# Patient Record
Sex: Male | Born: 2000 | Race: White | Hispanic: No | Marital: Single
Health system: Southern US, Community
[De-identification: ages and names within clinical notes are randomized; demographics above are authoritative.]

---
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
## 2022-01-01 ENCOUNTER — Ambulatory Visit: Payer: Medicaid Other | Attending: Orthopaedic Surgery | Admitting: Physical Therapy

## 2022-01-01 ENCOUNTER — Encounter: Payer: Self-pay | Admitting: Physical Therapy

## 2022-01-01 ENCOUNTER — Other Ambulatory Visit: Payer: Self-pay

## 2022-01-01 DIAGNOSIS — M25562 Pain in left knee: Secondary | ICD-10-CM | POA: Diagnosis present

## 2022-01-01 DIAGNOSIS — R600 Localized edema: Secondary | ICD-10-CM | POA: Diagnosis present

## 2022-01-01 DIAGNOSIS — M6281 Muscle weakness (generalized): Secondary | ICD-10-CM | POA: Insufficient documentation

## 2022-01-01 DIAGNOSIS — R2689 Other abnormalities of gait and mobility: Secondary | ICD-10-CM | POA: Insufficient documentation
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
## 2022-01-03 ENCOUNTER — Other Ambulatory Visit: Payer: Self-pay

## 2022-01-03 ENCOUNTER — Ambulatory Visit: Payer: Medicaid Other | Admitting: Physical Therapy

## 2022-01-03 ENCOUNTER — Encounter: Payer: Self-pay | Admitting: Physical Therapy

## 2022-01-03 DIAGNOSIS — M25562 Pain in left knee: Secondary | ICD-10-CM | POA: Diagnosis not present

## 2022-01-03 DIAGNOSIS — M6281 Muscle weakness (generalized): Secondary | ICD-10-CM
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
## 2022-01-10 ENCOUNTER — Other Ambulatory Visit: Payer: Self-pay

## 2022-01-10 ENCOUNTER — Encounter: Payer: Self-pay | Admitting: Physical Therapy

## 2022-01-10 ENCOUNTER — Ambulatory Visit: Payer: Medicaid Other | Admitting: Physical Therapy

## 2022-01-10 DIAGNOSIS — R2689 Other abnormalities of gait and mobility: Secondary | ICD-10-CM

## 2022-01-10 DIAGNOSIS — M25562 Pain in left knee: Secondary | ICD-10-CM | POA: Diagnosis not present

## 2022-01-10 DIAGNOSIS — M6281 Muscle weakness (generalized): Secondary | ICD-10-CM
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
## 2022-01-15 ENCOUNTER — Ambulatory Visit: Payer: Medicaid Other | Admitting: Physical Therapy

## 2022-01-17 ENCOUNTER — Ambulatory Visit: Payer: Medicaid Other | Admitting: Physical Therapy

## 2022-01-22 ENCOUNTER — Other Ambulatory Visit: Payer: Self-pay

## 2022-01-22 ENCOUNTER — Encounter: Payer: Self-pay | Admitting: Physical Therapy

## 2022-01-22 ENCOUNTER — Ambulatory Visit: Payer: Medicaid Other | Admitting: Physical Therapy

## 2022-01-22 DIAGNOSIS — R2689 Other abnormalities of gait and mobility: Secondary | ICD-10-CM

## 2022-01-22 DIAGNOSIS — M25562 Pain in left knee: Secondary | ICD-10-CM | POA: Diagnosis not present

## 2022-01-22 DIAGNOSIS — M6281 Muscle weakness (generalized): Secondary | ICD-10-CM
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
## 2022-01-24 ENCOUNTER — Other Ambulatory Visit: Payer: Self-pay

## 2022-01-24 ENCOUNTER — Encounter: Payer: Self-pay | Admitting: Physical Therapy

## 2022-01-24 ENCOUNTER — Ambulatory Visit: Payer: Medicaid Other | Admitting: Physical Therapy

## 2022-01-24 DIAGNOSIS — M6281 Muscle weakness (generalized): Secondary | ICD-10-CM

## 2022-01-24 DIAGNOSIS — M25562 Pain in left knee: Secondary | ICD-10-CM

## 2022-01-24 DIAGNOSIS — R2689 Other abnormalities of gait and mobility: Secondary | ICD-10-CM
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
## 2022-01-31 ENCOUNTER — Other Ambulatory Visit: Payer: Self-pay

## 2022-01-31 ENCOUNTER — Ambulatory Visit: Payer: Medicaid Other | Attending: Orthopaedic Surgery

## 2022-01-31 DIAGNOSIS — R600 Localized edema: Secondary | ICD-10-CM | POA: Insufficient documentation

## 2022-01-31 DIAGNOSIS — M25562 Pain in left knee: Secondary | ICD-10-CM | POA: Insufficient documentation

## 2022-01-31 DIAGNOSIS — R2689 Other abnormalities of gait and mobility: Secondary | ICD-10-CM | POA: Diagnosis present

## 2022-01-31 DIAGNOSIS — M6281 Muscle weakness (generalized): Secondary | ICD-10-CM | POA: Insufficient documentation
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
## 2022-02-07 ENCOUNTER — Ambulatory Visit: Payer: Medicaid Other | Admitting: Physical Therapy

## 2022-02-07 ENCOUNTER — Other Ambulatory Visit: Payer: Self-pay

## 2022-02-07 ENCOUNTER — Encounter: Payer: Self-pay | Admitting: Physical Therapy

## 2022-02-07 DIAGNOSIS — R2689 Other abnormalities of gait and mobility: Secondary | ICD-10-CM

## 2022-02-07 DIAGNOSIS — M6281 Muscle weakness (generalized): Secondary | ICD-10-CM

## 2022-02-07 DIAGNOSIS — M25562 Pain in left knee: Secondary | ICD-10-CM
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
## 2022-02-12 ENCOUNTER — Ambulatory Visit: Payer: Medicaid Other | Admitting: Physical Therapy

## 2022-02-14 ENCOUNTER — Ambulatory Visit: Payer: Medicaid Other | Admitting: Physical Therapy
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
## 2022-02-19 ENCOUNTER — Other Ambulatory Visit: Payer: Self-pay

## 2022-02-19 ENCOUNTER — Ambulatory Visit: Payer: Medicaid Other

## 2022-02-19 DIAGNOSIS — M25562 Pain in left knee: Secondary | ICD-10-CM | POA: Diagnosis not present

## 2022-02-19 DIAGNOSIS — R600 Localized edema: Secondary | ICD-10-CM

## 2022-02-19 DIAGNOSIS — R2689 Other abnormalities of gait and mobility: Secondary | ICD-10-CM

## 2022-02-19 DIAGNOSIS — M6281 Muscle weakness (generalized): Secondary | ICD-10-CM

## 2022-02-21 ENCOUNTER — Ambulatory Visit: Payer: Medicaid Other | Admitting: Physical Therapy

## 2022-02-21 ENCOUNTER — Other Ambulatory Visit: Payer: Self-pay

## 2022-02-21 ENCOUNTER — Encounter: Payer: Self-pay | Admitting: Physical Therapy

## 2022-02-21 DIAGNOSIS — M6281 Muscle weakness (generalized): Secondary | ICD-10-CM

## 2022-02-21 DIAGNOSIS — R2689 Other abnormalities of gait and mobility: Secondary | ICD-10-CM

## 2022-02-21 DIAGNOSIS — M25562 Pain in left knee: Secondary | ICD-10-CM | POA: Diagnosis not present
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
## 2022-02-26 ENCOUNTER — Other Ambulatory Visit: Payer: Self-pay

## 2022-02-26 ENCOUNTER — Ambulatory Visit: Payer: Medicaid Other | Admitting: Physical Therapy

## 2022-02-26 ENCOUNTER — Encounter: Payer: Self-pay | Admitting: Physical Therapy

## 2022-02-26 DIAGNOSIS — M25562 Pain in left knee: Secondary | ICD-10-CM | POA: Diagnosis not present

## 2022-02-26 DIAGNOSIS — R2689 Other abnormalities of gait and mobility: Secondary | ICD-10-CM

## 2022-02-26 DIAGNOSIS — M6281 Muscle weakness (generalized): Secondary | ICD-10-CM
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
## 2022-02-28 ENCOUNTER — Ambulatory Visit: Payer: Medicaid Other | Admitting: Physical Therapy

## 2022-03-05 ENCOUNTER — Encounter: Payer: Self-pay | Admitting: Physical Therapy

## 2022-03-05 ENCOUNTER — Ambulatory Visit: Payer: Medicaid Other | Attending: Orthopaedic Surgery | Admitting: Physical Therapy

## 2022-03-05 ENCOUNTER — Other Ambulatory Visit: Payer: Self-pay

## 2022-03-05 DIAGNOSIS — M6281 Muscle weakness (generalized): Secondary | ICD-10-CM | POA: Insufficient documentation

## 2022-03-05 DIAGNOSIS — R2689 Other abnormalities of gait and mobility: Secondary | ICD-10-CM | POA: Insufficient documentation

## 2022-03-05 DIAGNOSIS — M25562 Pain in left knee: Secondary | ICD-10-CM | POA: Diagnosis not present

## 2022-03-05 DIAGNOSIS — R600 Localized edema: Secondary | ICD-10-CM | POA: Diagnosis present
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
## 2022-03-07 ENCOUNTER — Encounter: Payer: Self-pay | Admitting: Physical Therapy

## 2022-03-07 ENCOUNTER — Other Ambulatory Visit: Payer: Self-pay

## 2022-03-07 ENCOUNTER — Ambulatory Visit: Payer: Medicaid Other | Admitting: Physical Therapy

## 2022-03-07 DIAGNOSIS — R2689 Other abnormalities of gait and mobility: Secondary | ICD-10-CM

## 2022-03-07 DIAGNOSIS — M6281 Muscle weakness (generalized): Secondary | ICD-10-CM

## 2022-03-07 DIAGNOSIS — M25562 Pain in left knee: Secondary | ICD-10-CM | POA: Diagnosis not present
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
## 2022-03-12 ENCOUNTER — Ambulatory Visit: Payer: Medicaid Other | Admitting: Physical Therapy

## 2022-03-12 ENCOUNTER — Other Ambulatory Visit: Payer: Self-pay

## 2022-03-12 ENCOUNTER — Encounter: Payer: Self-pay | Admitting: Physical Therapy

## 2022-03-12 DIAGNOSIS — R2689 Other abnormalities of gait and mobility: Secondary | ICD-10-CM

## 2022-03-12 DIAGNOSIS — M25562 Pain in left knee: Secondary | ICD-10-CM

## 2022-03-12 DIAGNOSIS — M6281 Muscle weakness (generalized): Secondary | ICD-10-CM
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
## 2022-03-21 ENCOUNTER — Encounter: Payer: Self-pay | Admitting: Physical Therapy

## 2022-03-21 ENCOUNTER — Other Ambulatory Visit: Payer: Self-pay

## 2022-03-21 ENCOUNTER — Ambulatory Visit: Payer: Medicaid Other | Admitting: Physical Therapy

## 2022-03-21 DIAGNOSIS — R600 Localized edema: Secondary | ICD-10-CM

## 2022-03-21 DIAGNOSIS — M25562 Pain in left knee: Secondary | ICD-10-CM

## 2022-03-21 DIAGNOSIS — M6281 Muscle weakness (generalized): Secondary | ICD-10-CM

## 2022-03-21 DIAGNOSIS — R2689 Other abnormalities of gait and mobility: Secondary | ICD-10-CM
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
## 2022-03-26 ENCOUNTER — Encounter: Payer: Self-pay | Admitting: Physical Therapy

## 2022-03-28 ENCOUNTER — Ambulatory Visit: Payer: Medicaid Other | Admitting: Physical Therapy

## 2022-03-28 ENCOUNTER — Encounter: Payer: Self-pay | Admitting: Physical Therapy

## 2022-03-28 DIAGNOSIS — M6281 Muscle weakness (generalized): Secondary | ICD-10-CM

## 2022-03-28 DIAGNOSIS — R2689 Other abnormalities of gait and mobility: Secondary | ICD-10-CM

## 2022-03-28 DIAGNOSIS — M25562 Pain in left knee: Secondary | ICD-10-CM | POA: Diagnosis not present
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
## 2022-04-02 ENCOUNTER — Encounter: Payer: Self-pay | Admitting: Physical Therapy

## 2022-04-02 ENCOUNTER — Ambulatory Visit: Payer: Medicaid Other | Attending: Orthopaedic Surgery | Admitting: Physical Therapy

## 2022-04-02 DIAGNOSIS — M25562 Pain in left knee: Secondary | ICD-10-CM | POA: Diagnosis present

## 2022-04-02 DIAGNOSIS — M6281 Muscle weakness (generalized): Secondary | ICD-10-CM | POA: Diagnosis present

## 2022-04-02 DIAGNOSIS — R2689 Other abnormalities of gait and mobility: Secondary | ICD-10-CM | POA: Diagnosis present
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
## 2022-04-04 ENCOUNTER — Encounter: Payer: Self-pay | Admitting: Physical Therapy

## 2022-04-09 ENCOUNTER — Ambulatory Visit: Payer: Medicaid Other | Admitting: Physical Therapy

## 2022-04-11 ENCOUNTER — Encounter: Payer: Self-pay | Admitting: Physical Therapy

## 2022-04-16 ENCOUNTER — Ambulatory Visit: Payer: Medicaid Other | Admitting: Physical Therapy

## 2022-04-16 ENCOUNTER — Encounter: Payer: Self-pay | Admitting: Physical Therapy

## 2022-04-16 DIAGNOSIS — M25562 Pain in left knee: Secondary | ICD-10-CM

## 2022-04-16 DIAGNOSIS — R2689 Other abnormalities of gait and mobility: Secondary | ICD-10-CM

## 2022-04-16 DIAGNOSIS — M6281 Muscle weakness (generalized): Secondary | ICD-10-CM
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
## 2022-04-18 ENCOUNTER — Encounter: Payer: Self-pay | Admitting: Physical Therapy

## 2022-04-23 ENCOUNTER — Ambulatory Visit: Payer: Medicaid Other | Admitting: Physical Therapy

## 2022-04-23 ENCOUNTER — Encounter: Payer: Self-pay | Admitting: Physical Therapy

## 2022-04-23 DIAGNOSIS — M25562 Pain in left knee: Secondary | ICD-10-CM | POA: Diagnosis not present

## 2022-04-23 DIAGNOSIS — R2689 Other abnormalities of gait and mobility: Secondary | ICD-10-CM

## 2022-04-23 DIAGNOSIS — M6281 Muscle weakness (generalized): Secondary | ICD-10-CM
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
## 2022-05-07 ENCOUNTER — Encounter: Payer: Self-pay | Admitting: Physical Therapy

## 2022-05-07 ENCOUNTER — Ambulatory Visit: Payer: Medicaid Other | Attending: Orthopaedic Surgery | Admitting: Physical Therapy

## 2022-05-07 DIAGNOSIS — R2689 Other abnormalities of gait and mobility: Secondary | ICD-10-CM | POA: Diagnosis present

## 2022-05-07 DIAGNOSIS — M6281 Muscle weakness (generalized): Secondary | ICD-10-CM | POA: Diagnosis present

## 2022-05-07 DIAGNOSIS — M25562 Pain in left knee: Secondary | ICD-10-CM | POA: Diagnosis present

## 2022-05-07 DIAGNOSIS — R600 Localized edema: Secondary | ICD-10-CM | POA: Insufficient documentation
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
## 2022-05-14 ENCOUNTER — Ambulatory Visit: Payer: Medicaid Other | Admitting: Physical Therapy

## 2022-05-21 ENCOUNTER — Ambulatory Visit: Payer: Medicaid Other | Admitting: Physical Therapy

## 2022-05-28 ENCOUNTER — Ambulatory Visit: Payer: Medicaid Other | Admitting: Physical Therapy

## 2022-06-25 ENCOUNTER — Ambulatory Visit: Payer: Medicaid Other | Attending: Orthopaedic Surgery

## 2022-06-25 DIAGNOSIS — M25562 Pain in left knee: Secondary | ICD-10-CM | POA: Insufficient documentation

## 2022-06-25 DIAGNOSIS — R2689 Other abnormalities of gait and mobility: Secondary | ICD-10-CM | POA: Insufficient documentation

## 2022-06-25 DIAGNOSIS — M6281 Muscle weakness (generalized): Secondary | ICD-10-CM | POA: Diagnosis present

## 2022-06-25 DIAGNOSIS — R600 Localized edema: Secondary | ICD-10-CM | POA: Insufficient documentation
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
## 2022-07-04 ENCOUNTER — Ambulatory Visit: Payer: Self-pay | Attending: Orthopaedic Surgery

## 2022-07-04 DIAGNOSIS — M6281 Muscle weakness (generalized): Secondary | ICD-10-CM | POA: Insufficient documentation

## 2022-07-04 DIAGNOSIS — R2689 Other abnormalities of gait and mobility: Secondary | ICD-10-CM | POA: Insufficient documentation

## 2022-07-04 DIAGNOSIS — R600 Localized edema: Secondary | ICD-10-CM | POA: Insufficient documentation

## 2022-07-04 DIAGNOSIS — M25562 Pain in left knee: Secondary | ICD-10-CM | POA: Insufficient documentation
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
## 2022-07-09 ENCOUNTER — Ambulatory Visit: Payer: Self-pay

## 2022-07-11 ENCOUNTER — Ambulatory Visit: Payer: Self-pay | Admitting: Physical Therapy

## 2022-07-16 ENCOUNTER — Ambulatory Visit: Payer: Self-pay

## 2022-07-25 ENCOUNTER — Encounter: Payer: Self-pay | Admitting: Physical Therapy

## 2022-07-30 ENCOUNTER — Encounter: Payer: Self-pay | Admitting: Physical Therapy
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
